# Patient Record
Sex: Female | Born: 2005 | Race: Black or African American | Hispanic: No | Marital: Single | Smoking: Never smoker
Health system: Southern US, Community
[De-identification: ages and names within clinical notes are randomized; demographics above are authoritative.]

## PROBLEM LIST (undated history)

## (undated) DIAGNOSIS — Z9109 Other allergy status, other than to drugs and biological substances: Secondary | ICD-10-CM

## (undated) DIAGNOSIS — F419 Anxiety disorder, unspecified: Secondary | ICD-10-CM

## (undated) DIAGNOSIS — F32A Depression, unspecified: Secondary | ICD-10-CM

## (undated) DIAGNOSIS — F329 Major depressive disorder, single episode, unspecified: Secondary | ICD-10-CM

---
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
## 2023-12-08 ENCOUNTER — Ambulatory Visit: Admitting: Allergy
